# Patient Record
Sex: Male | Born: 1965 | Race: White | Hispanic: No | Marital: Single | Smoking: Current every day smoker
Health system: Southern US, Community
[De-identification: ages and names within clinical notes are randomized; demographics above are authoritative.]

## PROBLEM LIST (undated history)

## (undated) DIAGNOSIS — F909 Attention-deficit hyperactivity disorder, unspecified type: Secondary | ICD-10-CM

---

## 2004-10-05 ENCOUNTER — Emergency Department (HOSPITAL_COMMUNITY): Admission: EM | Admit: 2004-10-05 | Discharge: 2004-10-05 | Payer: Self-pay | Admitting: Emergency Medicine

## 2005-08-16 ENCOUNTER — Emergency Department (HOSPITAL_COMMUNITY): Admission: EM | Admit: 2005-08-16 | Discharge: 2005-08-16 | Payer: Self-pay | Admitting: Family Medicine

## 2005-08-24 ENCOUNTER — Ambulatory Visit (HOSPITAL_COMMUNITY): Admission: RE | Admit: 2005-08-24 | Discharge: 2005-08-24 | Payer: Self-pay | Admitting: Internal Medicine

## 2005-08-24 ENCOUNTER — Ambulatory Visit: Payer: Self-pay | Admitting: Internal Medicine

## 2005-09-19 ENCOUNTER — Emergency Department (HOSPITAL_COMMUNITY): Admission: EM | Admit: 2005-09-19 | Discharge: 2005-09-19 | Payer: Self-pay | Admitting: Family Medicine

## 2005-10-08 ENCOUNTER — Emergency Department (HOSPITAL_COMMUNITY): Admission: EM | Admit: 2005-10-08 | Discharge: 2005-10-08 | Payer: Self-pay | Admitting: Family Medicine

## 2009-07-21 ENCOUNTER — Emergency Department: Payer: Self-pay | Admitting: Emergency Medicine

## 2012-04-09 ENCOUNTER — Emergency Department: Payer: Self-pay | Admitting: *Deleted

## 2014-08-12 ENCOUNTER — Inpatient Hospital Stay: Payer: Self-pay | Admitting: Internal Medicine

## 2014-08-12 LAB — COMPREHENSIVE METABOLIC PANEL
ALK PHOS: 84 U/L
ANION GAP: 6 — AB (ref 7–16)
AST: 25 U/L (ref 15–37)
Albumin: 3.3 g/dL — ABNORMAL LOW (ref 3.4–5.0)
BILIRUBIN TOTAL: 0.2 mg/dL (ref 0.2–1.0)
BUN: 17 mg/dL (ref 7–18)
CO2: 26 mmol/L (ref 21–32)
Calcium, Total: 8.4 mg/dL — ABNORMAL LOW (ref 8.5–10.1)
Chloride: 109 mmol/L — ABNORMAL HIGH (ref 98–107)
Creatinine: 1.02 mg/dL (ref 0.60–1.30)
EGFR (African American): >60
GLUCOSE: 107 mg/dL — AB (ref 65–99)
OSMOLALITY: 283 (ref 275–301)
Potassium: 3.9 mmol/L (ref 3.5–5.1)
SGPT (ALT): 28 U/L
SODIUM: 141 mmol/L (ref 136–145)
Total Protein: 6.8 g/dL (ref 6.4–8.2)

## 2014-08-12 LAB — CBC
HCT: 43.6 % (ref 40.0–52.0)
HGB: 14.3 g/dL (ref 13.0–18.0)
MCH: 31.1 pg (ref 26.0–34.0)
MCHC: 32.9 g/dL (ref 32.0–36.0)
MCV: 95 fL (ref 80–100)
PLATELETS: 265 10*3/uL (ref 150–440)
RBC: 4.6 10*6/uL (ref 4.40–5.90)
RDW: 13.8 % (ref 11.5–14.5)
WBC: 8.3 10*3/uL (ref 3.8–10.6)

## 2014-08-13 LAB — HEMOGLOBIN A1C: Hemoglobin A1C: 5.7 % (ref 4.2–6.3)

## 2014-08-17 LAB — CULTURE, BLOOD (SINGLE)

## 2014-12-06 NOTE — H&P (Signed)
PATIENT NAME:  Brett MillardWHITE, Ercil W MR#:  [id number] DATE OF BIRTH:  Oct 28, 1965  DATE OF ADMISSION:  08/12/2014  REFERRING PHYSICIAN:  Kathreen DevoidKevin A. Paduchowski, MD  PRIMARY CARE PHYSICIAN:  None.   ADMITTING PHYSICIAN:  Crissie FiguresEdavally N. Antha Niday, MD   CHIEF COMPLAINT:  Pain, swelling, and discharging wound of right hand for the past 2 weeks.  HISTORY OF PRESENT ILLNESS:  This is a 49 year old Caucasian male with no significant past medical history, who presents with the complaints of pain and swelling of the right hand with discharging wound ongoing for the past 2 weeks. The patient states that he had a foreign body stuck about 2 months ago, following which he started having some pain and swelling of the right hand middle finger, which gradually worsened in the past 2 weeks, and he developed a small superficial discharging wound around 2 weeks ago. At that time, he tried to  take out the foreign body with a needle and was able to just take out some dark-colored material but continued to have increasing pain and swelling with a discharging wound and came to the Emergency Room for further evaluation. The patient did not consult any medical provider for this problem, which has been going on for the past few weeks. No history of any fever. No loss of sensation or power.   In the Emergency Room, the patient was evaluated by the ED physician and was found to have cellulitis of the right hand and x-ray of the right hand revealed soft tissue swelling significant for soft tissue infection and also 1 mm foreign body along the right middle finger. Hence, the hospitalist service was consulted for further evaluation and management.   ED physician also consulted orthopedic surgeon on-call, who recommended for admission of the patient to the medical floor and IV antibiotics and he will consult in the morning. The patient denies any other symptoms such as fever or chills, chest pain, shortness of breath, cough, nausea, vomiting,  diarrhea, abdominal pain, or urinary symptoms. Blood cultures were drawn, and the patient was started on IV antibiotics, namely vancomycin and Zosyn, and currently the patient is comfortably lying in the bed. The pain in the right hand is under control with pain medications.   PAST MEDICAL HISTORY:  No history of hypertension, diabetes mellitus, or COPD.   PAST SURGICAL HISTORY:  No history of any surgeries performed in the past.   HOME MEDICATIONS:  No home medications the patient is taking at this time.   ALLERGIES:  VICODIN, WHICH CAUSES STOMACH UPSET.   FAMILY HISTORY:  Significant for mother with diabetes mellitus and father with diabetes mellitus and bone cancer.   SOCIAL HISTORY:  He is single. History of smoking about 1-1/2 packs per day for the past many years. Denies any alcohol or substance abuse.   REVIEW OF SYSTEMS:   CONSTITUTIONAL:  Negative for fever or chills. No fatigue. No generalized weakness.  EYES:  Negative for blurred vision or double vision. No pain. No redness. No inflammation.  EARS, NOSE, AND THROAT:  Negative for tinnitus, ear pain, hearing loss, epistaxis, nasal discharge, or difficulty swallowing.  RESPIRATORY:  Negative for cough, wheezing, hemoptysis, dyspnea, or painful respirations.  CARDIOVASCULAR:  Negative for chest pain, orthopnea, pedal edema, dyspnea on exertion, palpitations, dizziness, or syncope.  GASTROINTESTINAL:  Negative for nausea, vomiting, diarrhea, abdominal pain, hematemesis, melena, or GERD symptoms.  GENITOURINARY:  Negative for dysuria, hematuria, frequency, or urgency.  ENDOCRINE:  Negative for polyuria or polydipsia. No nocturia.  No heat or cold intolerance.  HEMATOLOGIC:  Negative for anemia, easy bruising or bleeding, or swollen glands.  INTEGUMENTARY:  Discharging wound on the right hand at the base of the middle finger. MUSCULOSKELETAL:  There is pain and swelling of the right hand middle finger as mentioned in the history of  present illness ongoing for the past few weeks with a discharging wound at the base of the middle finger on the right hand.  NEUROLOGICAL:  Negative for focal weakness or numbness. No history of CVA, TIA, or seizure disorder.  PSYCHIATRIC:  No anxiety, insomnia, or depression.   PHYSICAL EXAMINATION:  VITAL SIGNS:  Temperature 98.7 degrees Fahrenheit, pulse rate 90 per minute, respirations 18 per minute, blood pressure 117/80, oxygen saturation 95% on room air.  GENERAL:  Well-developed, well-nourished young male, alert, awake, and oriented, pleasant and cooperative, comfortably lying in the bed, not in any acute distress.  HEAD:  Atraumatic, normocephalic.  EYES:  Pupils are equal and react to light and accommodation. No conjunctival pallor. No scleral icterus. Extraocular movements are intact.  NOSE:  No nasal lesions. No external lesions.  ORAL CAVITY:  No mucosal lesions. No exudates.  NECK:  Supple. No JVD. No thyromegaly. No carotid bruit. Range of motion of the neck is normal.  RESPIRATORY:  Good respiratory effort. Not using accessory muscles of respiration. Bilateral vesicular breath sounds are present. No rales or rhonchi.  CARDIOVASCULAR:  S1, S2 regular. No murmurs appreciated. Peripheral pulses are equal at carotid, femoral, and pedal pulses. No peripheral edema.  GASTROINTESTINAL:  Abdomen is soft and nontender. No hepatosplenomegaly. Bowel sounds present and equal in all 4 quadrants. No tenderness. No rigidity. No guarding.  GENITOURINARY:  Deferred.  MUSCULOSKELETAL:  Right hand middle finger swelling with redness extending up to the right palm anterior aspect present and 1 cm superficial discharging wound with local edema present on the base of the right middle finger and the right palm. Local tenderness present. Peripheral pulses are intact. Neurovascularly intact peripherally.  SKIN:  Inspection shows 1 cm wide discharging ulcer on the right palm at the base of the right middle  finger.  LYMPHATIC:  No cervical lymphadenopathy.  VASCULAR:  Good dorsalis pedis and posterior tibial pulses.  NEUROLOGICAL:  Alert, awake, and oriented x 3. Cranial nerves II through XII are grossly intact. Motor strength is 5/5 in both upper and lower extremities. DTRs are 2+ bilaterally and symmetrical.  PSYCHIATRIC:  Judgment and insight are adequate. Alert and oriented x 3. No memory or mood disturbances.   LABORATORY DATA:  Serum glucose is 107, BUN 17, creatinine 1.02, sodium 141, potassium 3.9, chloride 109, bicarbonate 26, total calcium 8.4, serum protein 6.8, albumin 3.3, total bilirubin 0.2, alkaline phosphatase 84, AST 25, and ALT 28. WBC is 8.3, hemoglobin 14.3, hematocrit 43.6, and platelet count 265,000.   IMAGING STUDIES:   X-ray of the right hand:   1.  Middle finger soft tissue swelling without evidence of osseous infection or subcutaneous gas.   2.  A 1 mm foreign body along the third middle pharynx of indeterminate significance given small size.    ASSESSMENT AND PLAN:  This is a 49 year old Caucasian male with no significant past medical history, who presents with the complaints of ongoing pain and swelling of the right hand for the past 2 weeks with a discharging superficial wound.    1.  Cellulitis of the right hand middle finger following a foreign body. The patient is afebrile. Hosein blood cell count  is normal. Plan:  Admit to med-surge floor, blood cultures, IV antibiotics of vancomycin and Zosyn, local wound care, and orthopedic consult.  2.  Foreign body in the right middle finger 1 mm in size. Plan:  Orthopedics on-call was consulted by ED physician and advised to consult in the morning. Orthopedic consult placed for further followup.  3.  Active smoker. Counseled and advised to quit smoking. Offered nicotine replacement treatment. The patient not decided at this time.  4.  Mildly elevated blood sugars. No history of diabetes mellitus, but positive family history of  diabetes mellitus. Rule out diabetes mellitus. Check hemoglobin A1c and follow up accordingly.  5.  Deep vein thrombosis prophylaxis with subcutaneous Lovenox.  6.  Gastrointestinal prophylaxis with Protonix.   CODE STATUS:  Full code.   TIME SPENT:  45 minutes.   ____________________________ Crissie Figures, MD enr:nb D: 08/12/2014 02:50:58 ET T: 08/12/2014 03:17:22 ET JOB#: [id number]  cc: Crissie Figures, MD, <Dictator> Crissie Figures MD ELECTRONICALLY SIGNED 08/12/2014 20:34

## 2014-12-06 NOTE — Consult Note (Signed)
Brief Consult Note: Diagnosis: cellulitis right hand.   Patient was seen by consultant.   Comments: no evidence of flexor sheath infection or need for surgical I+D at present, may need if abscess develops.  Electronic Signatures: Leitha SchullerMenz, Jasmarie Coppock J (MD)  (Signed 29-Dec-15 07:36)  Authored: Brief Consult Note   Last Updated: 29-Dec-15 07:36 by Leitha SchullerMenz, Camarion Weier J (MD)

## 2014-12-10 NOTE — Discharge Summary (Signed)
Dates of Admission and Diagnosis:  Date of Admission 12-Aug-2014   Date of Discharge 13-Aug-2014   Admitting Diagnosis Foreign body and cellulitis in finger.   Final Diagnosis Foreign body and cellulitis in finger. Smoking.    Chief Complaint/History of Present Illness a 49 year old Caucasian male with no significant past medical history, who presents with the complaints of pain and swelling of the right hand with discharging wound ongoing for the past 2 weeks. The patient states that he had a foreign body stuck about 2 months ago, following which he started having some pain and swelling of the right hand middle finger, which gradually worsened in the past 2 weeks, and he developed a small superficial discharging wound around 2 weeks ago. At that time, he tried to  take out the foreign body with a needle and was able to just take out some dark-colored material but continued to have increasing pain and swelling with a discharging wound and came to the Emergency Room for further evaluation. The patient did not consult any medical provider for this problem, which has been going on for the past few weeks. No history of any fever. No loss of sensation or power.   In the Emergency Room, the patient was evaluated by the ED physician and was found to have cellulitis of the right hand and x-ray of the right hand revealed soft tissue swelling significant for soft tissue infection and also 1 mm foreign body along the right middle finger. Hence, the hospitalist service was consulted for further evaluation and management.  ED physician also consulted orthopedic surgeon on-call, who recommended for admission of the patient to the medical floor and IV antibiotics and he will consult in the morning. The patient denies any other symptoms such as fever or chills, chest pain, shortness of breath, cough, nausea, vomiting, diarrhea, abdominal pain, or urinary symptoms. Blood cultures were drawn, and the patient was  started on IV antibiotics, namely vancomycin and Zosyn, and currently the patient is comfortably lying in the bed. The pain in the right hand is under control with pain medications.   Allergies:  Vicodin: N/V/Diarrhea  Pertinent Past History:  Pertinent Past History none.   Hospital Course:  Hospital Course a 49 year old Caucasian male with no significant past medical history, who presents with the complaints of ongoing pain and swelling of the right hand for the past 2 weeks with a discharging superficial wound.    1.  Cellulitis of the right hand middle finger following a foreign body.   The patient is afebrile. Stangelo blood cell count is normal.    blood cultures, IV antibiotics of vancomycin and Zosyn, now swiched to clindamycin as much improved.    As per oprtho- no indication for surgery.  2.  Foreign body in the right middle finger 1 mm in size.     no need for surgery per ortho. 3.  Active smoker. Counseled and advised to quit smoking for 4 min.   Offered nicotine replacement treatment. 4.  Mildly elevated blood sugars. No history of diabetes mellitus, but positive family history of diabetes mellitus.  normal hemoglobin A1c. 5.  Deep vein thrombosis prophylaxis with subcutaneous Lovenox.   Condition on Discharge Stable   Code Status:  Code Status Full Code   DISCHARGE INSTRUCTIONS HOME MEDS:  Medication Reconciliation: Patient's Home Medications at Discharge:     Medication Instructions  acetaminophen-oxycodone 325 mg-5 mg oral tablet  1 tab(s) orally 1 to 3 times a day, As Needed, severe pain (  7-10/10) , As needed, severe pain (7-10/10)   clindamycin 300 mg oral capsule  1 cap(s) orally every 8 hours x 8 days   nicotine 14 mg/24 hr transdermal film, extended release  1 patch transdermal once a day     Physician's Instructions:  Diet Regular   Activity Limitations As tolerated   Return to Work Not Applicable   Time frame for Follow Up Appointment 1-2 weeks   Orthopedic clinic   Other Comments If swelling worsens or pain increases, please see orthopedics clinic.   TIME SPENT:  Total Time: Greater than 30 minutes   Electronic Signatures: Altamese Dilling (MD)  (Signed 01-Jan-16 09:12)  Authored: ADMISSION DATE AND DIAGNOSIS, CHIEF COMPLAINT/HPI, Allergies, PERTINENT PAST HISTORY, HOSPITAL COURSE, DISCHARGE INSTRUCTIONS HOME MEDS, PATIENT INSTRUCTIONS, TIME SPENT   Last Updated: 01-Jan-16 09:12 by Altamese Dilling (MD)

## 2016-06-06 ENCOUNTER — Emergency Department
Admission: EM | Admit: 2016-06-06 | Discharge: 2016-06-06 | Disposition: A | Discharge status: Home / Self Care | Payer: Self-pay | Attending: Emergency Medicine | Admitting: Emergency Medicine

## 2016-06-06 ENCOUNTER — Emergency Department: Payer: Self-pay

## 2016-06-06 ENCOUNTER — Encounter: Payer: Self-pay | Admitting: Emergency Medicine

## 2016-06-06 DIAGNOSIS — F17210 Nicotine dependence, cigarettes, uncomplicated: Secondary | ICD-10-CM | POA: Insufficient documentation

## 2016-06-06 DIAGNOSIS — F909 Attention-deficit hyperactivity disorder, unspecified type: Secondary | ICD-10-CM | POA: Insufficient documentation

## 2016-06-06 DIAGNOSIS — J441 Chronic obstructive pulmonary disease with (acute) exacerbation: Secondary | ICD-10-CM | POA: Insufficient documentation

## 2016-06-06 DIAGNOSIS — M5432 Sciatica, left side: Secondary | ICD-10-CM | POA: Insufficient documentation

## 2016-06-06 HISTORY — DX: Attention-deficit hyperactivity disorder, unspecified type: F90.9

## 2016-06-06 LAB — CBC WITH DIFFERENTIAL/PLATELET
Basophils Absolute: 0.1 10*3/uL (ref 0–0.1)
Basophils Relative: 1 %
EOS ABS: 0.3 10*3/uL (ref 0–0.7)
Eosinophils Relative: 2 %
HEMATOCRIT: 44.8 % (ref 40.0–52.0)
HEMOGLOBIN: 15.2 g/dL (ref 13.0–18.0)
LYMPHS ABS: 2.9 10*3/uL (ref 1.0–3.6)
LYMPHS PCT: 22 %
MCH: 30.8 pg (ref 26.0–34.0)
MCHC: 34 g/dL (ref 32.0–36.0)
MCV: 90.6 fL (ref 80.0–100.0)
MONOS PCT: 6 %
Monocytes Absolute: 0.8 10*3/uL (ref 0.2–1.0)
NEUTROS PCT: 69 %
Neutro Abs: 9.3 10*3/uL — ABNORMAL HIGH (ref 1.4–6.5)
Platelets: 249 10*3/uL (ref 150–440)
RBC: 4.94 MIL/uL (ref 4.40–5.90)
RDW: 13.9 % (ref 11.5–14.5)
WBC: 13.4 10*3/uL — AB (ref 3.8–10.6)

## 2016-06-06 LAB — COMPREHENSIVE METABOLIC PANEL
ALK PHOS: 70 U/L (ref 38–126)
ALT: 20 U/L (ref 17–63)
ANION GAP: 7 (ref 5–15)
AST: 29 U/L (ref 15–41)
Albumin: 4 g/dL (ref 3.5–5.0)
BILIRUBIN TOTAL: 0.7 mg/dL (ref 0.3–1.2)
BUN: 9 mg/dL (ref 6–20)
CALCIUM: 8.9 mg/dL (ref 8.9–10.3)
CO2: 22 mmol/L (ref 22–32)
CREATININE: 0.91 mg/dL (ref 0.61–1.24)
Chloride: 108 mmol/L (ref 101–111)
Glucose, Bld: 114 mg/dL — ABNORMAL HIGH (ref 65–99)
Potassium: 3.8 mmol/L (ref 3.5–5.1)
SODIUM: 137 mmol/L (ref 135–145)
TOTAL PROTEIN: 7 g/dL (ref 6.5–8.1)

## 2016-06-06 LAB — TROPONIN I

## 2016-06-06 MED ORDER — ALBUTEROL SULFATE (2.5 MG/3ML) 0.083% IN NEBU
5.0000 mg | INHALATION_SOLUTION | Freq: Once | RESPIRATORY_TRACT | Status: AC
  Administered 2016-06-06: 5 mg via RESPIRATORY_TRACT
  Filled 2016-06-06: qty 6

## 2016-06-06 MED ORDER — CYCLOBENZAPRINE HCL 5 MG PO TABS: 5.0000 mg | ORAL_TABLET | Freq: Three times a day (TID) | ORAL | 0 refills | Status: DC | PRN

## 2016-06-06 MED ORDER — IPRATROPIUM-ALBUTEROL 0.5-2.5 (3) MG/3ML IN SOLN
3.0000 mL | Freq: Once | RESPIRATORY_TRACT | Status: AC
  Administered 2016-06-06: 3 mL via RESPIRATORY_TRACT
  Filled 2016-06-06: qty 3

## 2016-06-06 MED ORDER — CYCLOBENZAPRINE HCL 10 MG PO TABS
5.0000 mg | ORAL_TABLET | Freq: Once | ORAL | Status: DC
  Filled 2016-06-06: qty 1

## 2016-06-06 MED ORDER — ALBUTEROL SULFATE HFA 108 (90 BASE) MCG/ACT IN AERS: 2.0000 | INHALATION_SPRAY | RESPIRATORY_TRACT | 0 refills | Status: DC | PRN

## 2016-06-06 MED ORDER — PREDNISONE 50 MG PO TABS: 50.0000 mg | ORAL_TABLET | Freq: Every day | ORAL | 0 refills | Status: AC

## 2016-06-06 MED ORDER — METHYLPREDNISOLONE SODIUM SUCC 125 MG IJ SOLR
125.0000 mg | Freq: Once | INTRAMUSCULAR | Status: AC
  Administered 2016-06-06: 125 mg via INTRAVENOUS
  Filled 2016-06-06: qty 2

## 2016-06-06 MED ORDER — SODIUM CHLORIDE 0.9 % IV BOLUS (SEPSIS)
1000.0000 mL | Freq: Once | INTRAVENOUS | Status: AC
  Administered 2016-06-06: 1000 mL via INTRAVENOUS

## 2016-06-06 MED ORDER — AZITHROMYCIN 250 MG PO TABS: ORAL_TABLET | ORAL | 0 refills | Status: AC

## 2016-06-06 MED ORDER — IBUPROFEN 800 MG PO TABS: 800.0000 mg | ORAL_TABLET | Freq: Three times a day (TID) | ORAL | 0 refills | Status: DC | PRN

## 2016-06-06 MED ORDER — KETOROLAC TROMETHAMINE 30 MG/ML IJ SOLN
30.0000 mg | Freq: Once | INTRAMUSCULAR | Status: AC
  Administered 2016-06-06: 30 mg via INTRAVENOUS
  Filled 2016-06-06: qty 1

## 2016-06-06 NOTE — ED Triage Notes (Signed)
C/O SOB for months and c/o left lower back pain that radiates down left leg with cough.

## 2016-06-06 NOTE — ED Provider Notes (Signed)
[facility]-EMERGENCY DEPARTMENT Provider Note   CSN: [id number] Arrival date & time: 06/06/16  1127     History   Chief Complaint Chief Complaint  Patient presents with  . Shortness of Breath    HPI Brett Oneill is a 50 y.o. male hx of ADHD, smoker, COPD here with SOB. SOB for the last few days. Has nonproductive cough as well. Patient has some chills as well. Patient still smokes at least a pack a day. Patient also has a history of sciatica and states that sometimes when he coughs he has shooting pain down the left leg. Denies any weakness to the leg. Denies any trouble walking or trouble urinating.   The history is provided by the patient.    Past Medical History:  Diagnosis Date  . Attention deficit hyperactivity disorder     There are no active problems to display for this patient.   History reviewed. No pertinent surgical history.     Home Medications    Prior to Admission medications   Not on File    Family History No family history on file.  Social History Social History  Substance Use Topics  . Smoking status: Current Every Day Smoker    Packs/day: 2.00    Types: Cigarettes  . Smokeless tobacco: Never Used  . Alcohol use No     Allergies   Vicodin [hydrocodone-acetaminophen]   Review of Systems Review of Systems  Respiratory: Positive for shortness of breath.   Musculoskeletal: Positive for back pain.  All other systems reviewed and are negative.    Physical Exam Updated Vital Signs BP 116/77   Pulse 72   Temp 97.4 F (36.3 C) (Oral)   Resp 15   Ht 5\' 6"  (1.676 m)   Wt 180 lb (81.6 kg)   SpO2 100%   BMI 29.05 kg/m   Physical Exam  Constitutional: He is oriented to person, place, and time.  Slightly uncomfortable   HENT:  Head: Normocephalic.  Eyes: EOM are normal. Pupils are equal, round, and reactive to light.  Neck: Normal range of motion. Neck supple.  Cardiovascular: Normal rate, regular rhythm and normal heart sounds.     Pulmonary/Chest:  Slightly tachypneic, mild wheezing throughout   Abdominal: Soft. Bowel sounds are normal.  Musculoskeletal: Normal range of motion.  Neurological: He is alert and oriented to person, place, and time.  CN 2-12 intact. + straight leg raise on L side. No saddle anesthesia. Nl strength lower extremities, nl gait   Skin: Skin is warm.  Nursing note and vitals reviewed.    ED Treatments / Results  Labs (all labs ordered are listed, but only abnormal results are displayed) Labs Reviewed  CBC WITH DIFFERENTIAL/PLATELET - Abnormal; Notable for the following:       Result Value   WBC 13.4 (*)    Neutro Abs 9.3 (*)    All other components within normal limits  COMPREHENSIVE METABOLIC PANEL - Abnormal; Notable for the following:    Glucose, Bld 114 (*)    All other components within normal limits  TROPONIN I    EKG  EKG Interpretation None      ED ECG REPORT I, Richardean Canalavid H Gabriela Giannelli, the attending physician, personally viewed and interpreted this ECG.   Date: 06/06/2016  EKG Time: 11:43 pm  Rate: 89  Rhythm: normal EKG, normal sinus rhythm  Axis: normal  Intervals:none  ST&T Change: nonspecific changes    Radiology Dg Chest 2 View  Result Date: 06/06/2016 CLINICAL  DATA:  SOB x 1week, coughing up mucus, trimmers, smokes 1 1/2 ppd EXAM: CHEST  2 VIEW COMPARISON:  None. FINDINGS: Mild hyperinflation. Midline trachea. Normal heart size and mediastinal contours. No pleural effusion or pneumothorax. Clear lungs. Diffuse peribronchial thickening. IMPRESSION: 1.  No acute cardiopulmonary disease. 2. Mild peribronchial thickening which may relate to chronic bronchitis or smoking. Electronically Signed   By: Jeronimo Greaves M.D.   On: 06/06/2016 12:10    Procedures Procedures (including critical care time)  Medications Ordered in ED Medications  ipratropium-albuterol (DUONEB) 0.5-2.5 (3) MG/3ML nebulizer solution 3 mL (not administered)  cyclobenzaprine (FLEXERIL) tablet 5  mg (not administered)  albuterol (PROVENTIL) (2.5 MG/3ML) 0.083% nebulizer solution 5 mg (5 mg Nebulization Given 06/06/16 1425)  methylPREDNISolone sodium succinate (SOLU-MEDROL) 125 mg/2 mL injection 125 mg (125 mg Intravenous Given 06/06/16 1425)  sodium chloride 0.9 % bolus 1,000 mL (1,000 mLs Intravenous New Bag/Given 06/06/16 1437)  ketorolac (TORADOL) 30 MG/ML injection 30 mg (30 mg Intravenous Given 06/06/16 1426)     Initial Impression / Assessment and Plan / ED Course  I have reviewed the triage vital signs and the nursing notes.  Pertinent labs & imaging results that were available during my care of the patient were reviewed by me and considered in my medical decision making (see chart for details).  Clinical Course    Brett Oneill is a 50 y.o. male here with SOB, also sciatica symptoms L side. Neurovascular intact so will not need MRI. Will check labs, CXR. Will give steroids, albuterol and likely give zpack.   3:02 PM Labs showed WBC 13. Chemistry unremarkable. CXR showed bronchitis. Will dc home with prednisone, albuterol, zpack. Will give flexeril, motrin for L leg sciatica.   Final Clinical Impressions(s) / ED Diagnoses   Final diagnoses:  None    New Prescriptions New Prescriptions   No medications on file     Charlynne Pander, MD 06/06/16 1502

## 2016-06-06 NOTE — Discharge Instructions (Signed)
Take zpack as prescribed.   Stop smoking.  Take prednisone as prescribed for 5 days.   Use albuterol as needed for wheezing.   Take motrin for back pain.   Take flexeril for back spasms.   See your doctor.   Return to ER if you have worse shortness of breath, fever, chest pain, vomiting, weakness, trouble walking, numbness, trouble urinating.

## 2016-06-21 ENCOUNTER — Emergency Department
Admission: EM | Admit: 2016-06-21 | Discharge: 2016-06-21 | Disposition: A | Discharge status: Home / Self Care | Payer: Self-pay | Attending: Emergency Medicine | Admitting: Emergency Medicine

## 2016-06-21 ENCOUNTER — Encounter: Payer: Self-pay | Admitting: Emergency Medicine

## 2016-06-21 ENCOUNTER — Emergency Department: Payer: Self-pay

## 2016-06-21 DIAGNOSIS — Y929 Unspecified place or not applicable: Secondary | ICD-10-CM | POA: Insufficient documentation

## 2016-06-21 DIAGNOSIS — F909 Attention-deficit hyperactivity disorder, unspecified type: Secondary | ICD-10-CM | POA: Insufficient documentation

## 2016-06-21 DIAGNOSIS — Z79899 Other long term (current) drug therapy: Secondary | ICD-10-CM | POA: Insufficient documentation

## 2016-06-21 DIAGNOSIS — Y9372 Activity, wrestling: Secondary | ICD-10-CM | POA: Insufficient documentation

## 2016-06-21 DIAGNOSIS — Z791 Long term (current) use of non-steroidal anti-inflammatories (NSAID): Secondary | ICD-10-CM | POA: Insufficient documentation

## 2016-06-21 DIAGNOSIS — F17210 Nicotine dependence, cigarettes, uncomplicated: Secondary | ICD-10-CM | POA: Insufficient documentation

## 2016-06-21 DIAGNOSIS — Y998 Other external cause status: Secondary | ICD-10-CM | POA: Insufficient documentation

## 2016-06-21 DIAGNOSIS — W1839XA Other fall on same level, initial encounter: Secondary | ICD-10-CM | POA: Insufficient documentation

## 2016-06-21 DIAGNOSIS — R0789 Other chest pain: Secondary | ICD-10-CM | POA: Insufficient documentation

## 2016-06-21 MED ORDER — TRAMADOL HCL 50 MG PO TABS: 50.0000 mg | ORAL_TABLET | Freq: Four times a day (QID) | ORAL | 0 refills | Status: AC | PRN

## 2016-06-21 MED ORDER — ORPHENADRINE CITRATE 30 MG/ML IJ SOLN
60.0000 mg | Freq: Two times a day (BID) | INTRAMUSCULAR | Status: DC
  Administered 2016-06-21: 60 mg via INTRAMUSCULAR
  Filled 2016-06-21: qty 2

## 2016-06-21 MED ORDER — KETOROLAC TROMETHAMINE 60 MG/2ML IM SOLN
60.0000 mg | Freq: Once | INTRAMUSCULAR | Status: AC
  Administered 2016-06-21: 60 mg via INTRAMUSCULAR
  Filled 2016-06-21: qty 2

## 2016-06-21 MED ORDER — IBUPROFEN 600 MG PO TABS: 600.0000 mg | ORAL_TABLET | Freq: Three times a day (TID) | ORAL | 0 refills | Status: DC | PRN

## 2016-06-21 MED ORDER — CYCLOBENZAPRINE HCL 10 MG PO TABS: 10.0000 mg | ORAL_TABLET | Freq: Three times a day (TID) | ORAL | 0 refills | Status: DC | PRN

## 2016-06-21 NOTE — ED Provider Notes (Signed)
[facility] Emergency Department Provider Note   ____________________________________________   First MD Initiated Contact with Patient 06/21/16 0945     (approximate)  I have reviewed the triage vital signs and the nursing notes.   HISTORY  Chief Complaint rib pain    HPI Lona MillardGerald W Canaday is a 50 y.o. male patient complaining of left-sided side rib pain secondary to wrestling with his dog yesterday. Patient state he might have pulled a muscle. Patient stated pain increases with deep breathing. No palliative measures taken for this complaint.She rated his pain as a 10 over 10. Patient described the pain as "sharp".   Past Medical History:  Diagnosis Date  . Attention deficit hyperactivity disorder     There are no active problems to display for this patient.   History reviewed. No pertinent surgical history.  Prior to Admission medications   Medication Sig Start Date End Date Taking? Authorizing Provider  albuterol (PROVENTIL HFA;VENTOLIN HFA) 108 (90 Base) MCG/ACT inhaler Inhale 2 puffs into the lungs every 4 (four) hours as needed for wheezing or shortness of breath (cough). 06/06/16   Charlynne Panderavid Hsienta Yao, MD  azithromycin (ZITHROMAX Z-PAK) 250 MG tablet 2 po day one, then 1 daily x 4 days 06/06/16   Charlynne Panderavid Hsienta Yao, MD  cyclobenzaprine (FLEXERIL) 10 MG tablet Take 1 tablet (10 mg total) by mouth 3 (three) times daily as needed. 06/21/16   Joni Reiningonald K Ladavion Savitz, PA-C  cyclobenzaprine (FLEXERIL) 5 MG tablet Take 1 tablet (5 mg total) by mouth 3 (three) times daily as needed for muscle spasms. 06/06/16   Charlynne Panderavid Hsienta Yao, MD  ibuprofen (ADVIL,MOTRIN) 600 MG tablet Take 1 tablet (600 mg total) by mouth every 8 (eight) hours as needed. 06/21/16   Joni Reiningonald K Tremaine Earwood, PA-C  ibuprofen (ADVIL,MOTRIN) 800 MG tablet Take 1 tablet (800 mg total) by mouth every 8 (eight) hours as needed. 06/06/16   Charlynne Panderavid Hsienta Yao, MD  predniSONE (DELTASONE) 50 MG tablet Take 1 tablet (50  mg total) by mouth daily with breakfast. 06/06/16   Charlynne Panderavid Hsienta Yao, MD  traMADol (ULTRAM) 50 MG tablet Take 1 tablet (50 mg total) by mouth every 6 (six) hours as needed. 06/21/16 06/21/17  Joni Reiningonald K Abdon Petrosky, PA-C    Allergies Vicodin [hydrocodone-acetaminophen]  No family history on file.  Social History Social History  Substance Use Topics  . Smoking status: Current Every Day Smoker    Packs/day: 2.00    Types: Cigarettes  . Smokeless tobacco: Never Used  . Alcohol use No    Review of Systems Constitutional: No fever/chills Eyes: No visual changes. ENT: No sore throat. Cardiovascular: Denies chest pain. Respiratory: Denies shortness of breath. Gastrointestinal: No abdominal pain.  No nausea, no vomiting.  No diarrhea.  No constipation. Genitourinary: Negative for dysuria. Musculoskeletal:Lateral rib pain Skin: Negative for rash. Neurological: Negative for headaches, focal weakness or numbness.    ____________________________________________   PHYSICAL EXAM:  VITAL SIGNS: ED Triage Vitals  Enc Vitals Group     BP 06/21/16 0933 124/82     Pulse Rate 06/21/16 0933 85     Resp 06/21/16 0933 18     Temp 06/21/16 0933 98 F (36.7 C)     Temp Source 06/21/16 0933 Oral     SpO2 06/21/16 0933 97 %     Weight 06/21/16 0925 180 lb (81.6 kg)     Height 06/21/16 0925 5\' 6"  (1.676 m)     Head Circumference --  Peak Flow --      Pain Score 06/21/16 0925 10     Pain Loc --      Pain Edu? --      Excl. in GC? --     Constitutional: Alert and oriented. Moderate distress Eyes: Conjunctivae are normal. PERRL. EOMI. Head: Atraumatic. Nose: No congestion/rhinnorhea. Mouth/Throat: Mucous membranes are moist.  Oropharynx non-erythematous. Neck: No stridor.  No cervical spine tenderness to palpation. Hematological/Lymphatic/Immunilogical: No cervical lymphadenopathy. Cardiovascular: Normal rate, regular rhythm. Grossly normal heart sounds.  Good peripheral  circulation. Respiratory: Normal respiratory effort.  No retractions. Lungs CTAB. Gastrointestinal: Soft and nontender. No distention. No abdominal bruits. No CVA tenderness. Musculoskeletal: Obvious deformity of the chest wall. Patient has splinting inspirations.  Neurologic:  Normal speech and language. No gross focal neurologic deficits are appreciated. No gait instability. Skin:  Skin is warm, dry and intact. No rash noted. Psychiatric: Mood and affect are normal. Speech and behavior are normal.  ____________________________________________   LABS (all labs ordered are listed, but only abnormal results are displayed)  Labs Reviewed - No data to display ____________________________________________  EKG   ____________________________________________  RADIOLOGY  No acute findings x-ray of the left ribs. ____________________________________________   PROCEDURES  Procedure(s) performed: None  Procedures  Critical Care performed: No  ____________________________________________   INITIAL IMPRESSION / ASSESSMENT AND PLAN / ED COURSE  Pertinent labs & imaging results that were available during my care of the patient were reviewed by me and considered in my medical decision making (see chart for details).  Left chest wall pain. Discussed negative x-ray findings with patient. Patient given discharge care instructions. Patient given a prescription for tramadol, Flexeril, Lexapro. Patient advised follow "clinic condition persists.  Clinical Course      ____________________________________________   FINAL CLINICAL IMPRESSION(S) / ED DIAGNOSES  Final diagnoses:  Left-sided chest wall pain      NEW MEDICATIONS STARTED DURING THIS VISIT:  New Prescriptions   CYCLOBENZAPRINE (FLEXERIL) 10 MG TABLET    Take 1 tablet (10 mg total) by mouth 3 (three) times daily as needed.   IBUPROFEN (ADVIL,MOTRIN) 600 MG TABLET    Take 1 tablet (600 mg total) by mouth every 8 (eight)  hours as needed.   TRAMADOL (ULTRAM) 50 MG TABLET    Take 1 tablet (50 mg total) by mouth every 6 (six) hours as needed.     Note:  This document was prepared using Dragon voice recognition software and may include unintentional dictation errors.    Joni Reiningonald K Fay Swider, PA-C 06/21/16 1040    Jene Everyobert Kinner, MD 06/21/16 1415

## 2016-06-21 NOTE — ED Notes (Addendum)
See triage note  Presents with pain to left rib area  Denies any fall but was wrestling with dog yesterday states he threw a stick and then he ran and fell  Landed on left rib area Ambulates to room holding lateral /anterior rib area

## 2016-06-21 NOTE — ED Triage Notes (Signed)
Pt to ed with c/o left sided rib pain today.  Pt states he was wrestling with his dog yesterday and feels he may have pulled a muscle.  Pt reports pain in left rib area with deep breath and movement.

## 2017-03-16 ENCOUNTER — Emergency Department
Admission: EM | Admit: 2017-03-16 | Discharge: 2017-03-16 | Disposition: A | Discharge status: Home / Self Care | Payer: Self-pay | Attending: Emergency Medicine | Admitting: Emergency Medicine

## 2017-03-16 ENCOUNTER — Emergency Department: Payer: Self-pay

## 2017-03-16 ENCOUNTER — Encounter: Payer: Self-pay | Admitting: Emergency Medicine

## 2017-03-16 DIAGNOSIS — Y9389 Activity, other specified: Secondary | ICD-10-CM | POA: Insufficient documentation

## 2017-03-16 DIAGNOSIS — M7022 Olecranon bursitis, left elbow: Secondary | ICD-10-CM | POA: Insufficient documentation

## 2017-03-16 DIAGNOSIS — Z79899 Other long term (current) drug therapy: Secondary | ICD-10-CM | POA: Insufficient documentation

## 2017-03-16 DIAGNOSIS — F17210 Nicotine dependence, cigarettes, uncomplicated: Secondary | ICD-10-CM | POA: Insufficient documentation

## 2017-03-16 MED ORDER — TRAMADOL HCL 50 MG PO TABS: 50.0000 mg | ORAL_TABLET | Freq: Four times a day (QID) | ORAL | 0 refills | Status: DC | PRN

## 2017-03-16 MED ORDER — NAPROXEN 500 MG PO TABS: 500.0000 mg | ORAL_TABLET | Freq: Two times a day (BID) | ORAL | Status: AC

## 2017-03-16 NOTE — ED Provider Notes (Signed)
[facility] Emergency Department Provider Note   ____________________________________________   First MD Initiated Contact with Patient 03/16/17 1352     (approximate)  I have reviewed the triage vital signs and the nursing notes.   HISTORY  Chief Complaint Elbow Pain    HPI Brett Oneill is a 51 y.o. male patient complaining of left elbow pain and swelling for 2 days. States increasing pain with extension of the forearm. Patient states swelling has increased overnight.Patient works in [employer] and performs repetitive motion. Patient rates pain as 8/10 describe the pain as "achy". No palliative measures for complaint.   Past Medical History:  Diagnosis Date  . Attention deficit hyperactivity disorder     There are no active problems to display for this patient.   History reviewed. No pertinent surgical history.  Prior to Admission medications   Medication Sig Start Date End Date Taking? Authorizing Provider  albuterol (PROVENTIL HFA;VENTOLIN HFA) 108 (90 Base) MCG/ACT inhaler Inhale 2 puffs into the lungs every 4 (four) hours as needed for wheezing or shortness of breath (cough). 06/06/16   Charlynne PanderYao, David Hsienta, MD  azithromycin (ZITHROMAX Z-PAK) 250 MG tablet 2 po day one, then 1 daily x 4 days 06/06/16   Charlynne PanderYao, David Hsienta, MD  cyclobenzaprine (FLEXERIL) 10 MG tablet Take 1 tablet (10 mg total) by mouth 3 (three) times daily as needed. 06/21/16   Joni ReiningSmith, Charistopher Rumble K, PA-C  cyclobenzaprine (FLEXERIL) 5 MG tablet Take 1 tablet (5 mg total) by mouth 3 (three) times daily as needed for muscle spasms. 06/06/16   Charlynne PanderYao, David Hsienta, MD  ibuprofen (ADVIL,MOTRIN) 600 MG tablet Take 1 tablet (600 mg total) by mouth every 8 (eight) hours as needed. 06/21/16   Joni ReiningSmith, Norina Cowper K, PA-C  ibuprofen (ADVIL,MOTRIN) 800 MG tablet Take 1 tablet (800 mg total) by mouth every 8 (eight) hours as needed. 06/06/16   Charlynne PanderYao, David Hsienta, MD  naproxen (NAPROSYN) 500 MG tablet  Take 1 tablet (500 mg total) by mouth 2 (two) times daily with a meal. 03/16/17   Joni ReiningSmith, Ameisha Mcclellan K, PA-C  predniSONE (DELTASONE) 50 MG tablet Take 1 tablet (50 mg total) by mouth daily with breakfast. 06/06/16   Charlynne PanderYao, David Hsienta, MD  traMADol (ULTRAM) 50 MG tablet Take 1 tablet (50 mg total) by mouth every 6 (six) hours as needed. 06/21/16 06/21/17  Joni ReiningSmith, Finnick Orosz K, PA-C  traMADol (ULTRAM) 50 MG tablet Take 1 tablet (50 mg total) by mouth every 6 (six) hours as needed for moderate pain. 03/16/17   Joni ReiningSmith, Ronell Boldin K, PA-C    Allergies Vicodin [hydrocodone-acetaminophen]  No family history on file.  Social History Social History  Substance Use Topics  . Smoking status: Current Every Day Smoker    Packs/day: 2.00    Types: Cigarettes  . Smokeless tobacco: Never Used  . Alcohol use No    Review of Systems  Constitutional: No fever/chills Eyes: No visual changes. ENT: No sore throat. Cardiovascular: Denies chest pain. Respiratory: Denies shortness of breath. Gastrointestinal: No abdominal pain.  No nausea, no vomiting.  No diarrhea.  No constipation. Genitourinary: Negative for dysuria. Musculoskeletal: Negative for back pain. Skin: Negative for rash. Neurological: Negative for headaches, focal weakness or numbness. Psychiatric:ADHD. Allergic/Immunilogical: Vicodin ____________________________________________   PHYSICAL EXAM:  VITAL SIGNS: ED Triage Vitals  Enc Vitals Group     BP 03/16/17 1334 127/87     Pulse Rate 03/16/17 1334 93     Resp 03/16/17 1334 16     Temp  03/16/17 1334 98.5 F (36.9 C)     Temp Source 03/16/17 1334 Oral     SpO2 03/16/17 1334 97 %     Weight 03/16/17 1335 180 lb (81.6 kg)     Height 03/16/17 1335 5\' 6"  (1.676 m)     Head Circumference --      Peak Flow --      Pain Score 03/16/17 1315 8     Pain Loc --      Pain Edu? --      Excl. in GC? --     Constitutional: Alert and oriented. Well appearing and in no acute distress. Cardiovascular:  Normal rate, regular rhythm. Grossly normal heart sounds.  Good peripheral circulation. Respiratory: Normal respiratory effort.  No retractions. Lungs CTAB. Musculoskeletal: No obvious deformity to the left elbow. Decreased range of motion with extension. Patient has moderate edema to the posterior elbow. No erythema.  Neurologic:  Normal speech and language. No gross focal neurologic deficits are appreciated. No gait instability. Skin:  Skin is warm, dry and intact. No rash noted. Psychiatric: Mood and affect are normal. Speech and behavior are normal.  ____________________________________________   LABS (all labs ordered are listed, but only abnormal results are displayed)  Labs Reviewed - No data to display ____________________________________________  EKG   ____________________________________________  RADIOLOGY  Dg Elbow Complete Left  Result Date: 03/16/2017 CLINICAL DATA:  Left elbow pain and swelling for 2 days without known injury. EXAM: LEFT ELBOW - COMPLETE 3+ VIEW COMPARISON:  None. FINDINGS: There is no evidence of fracture, dislocation, or joint effusion. There is no evidence of arthropathy or other focal bone abnormality. Soft tissues are unremarkable. IMPRESSION: Normal left elbow. Electronically Signed   By: Lupita RaiderJames  Green Jr, M.D.   On: 03/16/2017 14:26    __No acute findings x-ray of the elbow. __________________________________________   PROCEDURES  Procedure(s) performed: None  Procedures  Critical Care performed: No  ____________________________________________   INITIAL IMPRESSION / ASSESSMENT AND PLAN / ED COURSE  Pertinent labs & imaging results that were available during my care of the patient were reviewed by me and considered in my medical decision making (see chart for details). Patient presented with pain, swelling and decreased range of motion with extension of the left elbow for 2 days.  Olecranon bursitis of the left elbow. Discussed  negative x-ray finding with patient. Patient given discharge care instructions. Patient given a sling to wear for 3-5 days as needed. Patient advised to follow orthopedics clinic if no improvement in 5 days.    ____________________________________________   FINAL CLINICAL IMPRESSION(S) / ED DIAGNOSES  Final diagnoses:  Olecranon bursitis of left elbow      NEW MEDICATIONS STARTED DURING THIS VISIT:  New Prescriptions   NAPROXEN (NAPROSYN) 500 MG TABLET    Take 1 tablet (500 mg total) by mouth 2 (two) times daily with a meal.   TRAMADOL (ULTRAM) 50 MG TABLET    Take 1 tablet (50 mg total) by mouth every 6 (six) hours as needed for moderate pain.     Note:  This document was prepared using Dragon voice recognition software and may include unintentional dictation errors.    Joni ReiningSmith, Sam Wunschel K, PA-C 03/16/17 1439    Don PerkingVeronese, [place], MD 03/17/17 1215

## 2017-03-16 NOTE — ED Notes (Signed)

## 2017-03-16 NOTE — Discharge Instructions (Signed)
Arm sling for 3-5 days as needed. °

## 2017-03-16 NOTE — ED Triage Notes (Signed)
Patient presents to the ED with left elbow pain and swelling x 2 days.  Patient states, "I thought it was probably tennis elbow or something, but then my arm started getting tingly today and it got me worried."  Patient is in no obvious distress at this time.

## 2017-07-03 ENCOUNTER — Emergency Department
Admission: EM | Admit: 2017-07-03 | Discharge: 2017-07-03 | Disposition: A | Discharge status: Home / Self Care | Payer: Self-pay | Attending: Emergency Medicine | Admitting: Emergency Medicine

## 2017-07-03 ENCOUNTER — Encounter: Payer: Self-pay | Admitting: *Deleted

## 2017-07-03 ENCOUNTER — Emergency Department: Payer: Self-pay

## 2017-07-03 ENCOUNTER — Other Ambulatory Visit: Payer: Self-pay

## 2017-07-03 DIAGNOSIS — S39012A Strain of muscle, fascia and tendon of lower back, initial encounter: Secondary | ICD-10-CM | POA: Insufficient documentation

## 2017-07-03 DIAGNOSIS — Y999 Unspecified external cause status: Secondary | ICD-10-CM | POA: Insufficient documentation

## 2017-07-03 DIAGNOSIS — F17210 Nicotine dependence, cigarettes, uncomplicated: Secondary | ICD-10-CM | POA: Insufficient documentation

## 2017-07-03 DIAGNOSIS — Y939 Activity, unspecified: Secondary | ICD-10-CM | POA: Insufficient documentation

## 2017-07-03 DIAGNOSIS — Z791 Long term (current) use of non-steroidal anti-inflammatories (NSAID): Secondary | ICD-10-CM | POA: Insufficient documentation

## 2017-07-03 DIAGNOSIS — X509XXA Other and unspecified overexertion or strenuous movements or postures, initial encounter: Secondary | ICD-10-CM | POA: Insufficient documentation

## 2017-07-03 DIAGNOSIS — Z79899 Other long term (current) drug therapy: Secondary | ICD-10-CM | POA: Insufficient documentation

## 2017-07-03 DIAGNOSIS — Y929 Unspecified place or not applicable: Secondary | ICD-10-CM | POA: Insufficient documentation

## 2017-07-03 MED ORDER — TRAMADOL HCL 50 MG PO TABS: 50.0000 mg | ORAL_TABLET | Freq: Four times a day (QID) | ORAL | 0 refills | Status: DC | PRN

## 2017-07-03 MED ORDER — CYCLOBENZAPRINE HCL 10 MG PO TABS: 10.0000 mg | ORAL_TABLET | Freq: Three times a day (TID) | ORAL | 0 refills | Status: DC | PRN

## 2017-07-03 MED ORDER — IBUPROFEN 600 MG PO TABS: 600.0000 mg | ORAL_TABLET | Freq: Three times a day (TID) | ORAL | 0 refills | Status: DC | PRN
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
## 2017-07-03 NOTE — ED Notes (Signed)
Pt ambulatory upon discharge. Verbalized understanding of discharge instructions, prescriptions, follow-up care and pain management. VSS. A&O x4. Skin warm and dry.

## 2017-07-03 NOTE — ED Triage Notes (Signed)
Pt has chronic back pain, pt stepped off ladder today , complains of low back pain and left leg pain, pt denies any other symptoms

## 2019-07-30 ENCOUNTER — Emergency Department
Admission: EM | Admit: 2019-07-30 | Discharge: 2019-07-30 | Disposition: A | Discharge status: Home / Self Care | Payer: Self-pay | Attending: Emergency Medicine | Admitting: Emergency Medicine

## 2019-07-30 ENCOUNTER — Encounter: Payer: Self-pay | Admitting: Emergency Medicine

## 2019-07-30 ENCOUNTER — Other Ambulatory Visit: Payer: Self-pay

## 2019-07-30 ENCOUNTER — Emergency Department: Payer: Self-pay

## 2019-07-30 DIAGNOSIS — F17210 Nicotine dependence, cigarettes, uncomplicated: Secondary | ICD-10-CM | POA: Insufficient documentation

## 2019-07-30 DIAGNOSIS — R0781 Pleurodynia: Secondary | ICD-10-CM | POA: Insufficient documentation

## 2019-07-30 DIAGNOSIS — M546 Pain in thoracic spine: Secondary | ICD-10-CM | POA: Insufficient documentation

## 2019-07-30 LAB — CBC
HCT: 43.8 % (ref 39.0–52.0)
Hemoglobin: 14.6 g/dL (ref 13.0–17.0)
MCH: 29.8 pg (ref 26.0–34.0)
MCHC: 33.3 g/dL (ref 30.0–36.0)
MCV: 89.4 fL (ref 80.0–100.0)
Platelets: 284 10*3/uL (ref 150–400)
RBC: 4.9 MIL/uL (ref 4.22–5.81)
RDW: 13.9 % (ref 11.5–15.5)
WBC: 11.6 10*3/uL — ABNORMAL HIGH (ref 4.0–10.5)
nRBC: 0 % (ref 0.0–0.2)

## 2019-07-30 LAB — BASIC METABOLIC PANEL
Anion gap: 9 (ref 5–15)
BUN: 11 mg/dL (ref 6–20)
CO2: 21 mmol/L — ABNORMAL LOW (ref 22–32)
Calcium: 8.8 mg/dL — ABNORMAL LOW (ref 8.9–10.3)
Chloride: 107 mmol/L (ref 98–111)
Creatinine, Ser: 0.95 mg/dL (ref 0.61–1.24)
GFR calc Af Amer: >60 mL/min (ref >60)
GFR calc non Af Amer: >60 mL/min (ref >60)
Glucose, Bld: 143 mg/dL — ABNORMAL HIGH (ref 70–99)
Potassium: 3.8 mmol/L (ref 3.5–5.1)
Sodium: 137 mmol/L (ref 135–145)

## 2019-07-30 LAB — FIBRIN DERIVATIVES D-DIMER (ARMC ONLY): Fibrin derivatives D-dimer (ARMC): 393.28 ng/mL (FEU) (ref 0.00–499.00)

## 2019-07-30 LAB — TROPONIN I (HIGH SENSITIVITY)
Troponin I (High Sensitivity): 4 ng/L (ref <18)
Troponin I (High Sensitivity): 4 ng/L (ref <18)

## 2019-07-30 MED ORDER — IBUPROFEN 600 MG PO TABS: 600.0000 mg | ORAL_TABLET | Freq: Four times a day (QID) | ORAL | 0 refills | Status: AC | PRN

## 2019-07-30 MED ORDER — ALBUTEROL SULFATE (2.5 MG/3ML) 0.083% IN NEBU
2.5000 mg | INHALATION_SOLUTION | Freq: Once | RESPIRATORY_TRACT | Status: AC
  Administered 2019-07-30: 10:00:00 2.5 mg via RESPIRATORY_TRACT
  Filled 2019-07-30: qty 3

## 2019-07-30 MED ORDER — ALBUTEROL SULFATE HFA 108 (90 BASE) MCG/ACT IN AERS: 2.0000 | INHALATION_SPRAY | Freq: Four times a day (QID) | RESPIRATORY_TRACT | 1 refills | Status: AC | PRN

## 2019-07-30 MED ORDER — CYCLOBENZAPRINE HCL 10 MG PO TABS: 10.0000 mg | ORAL_TABLET | Freq: Three times a day (TID) | ORAL | 0 refills | Status: AC | PRN

## 2019-07-30 MED ORDER — KETOROLAC TROMETHAMINE 30 MG/ML IJ SOLN
30.0000 mg | Freq: Once | INTRAMUSCULAR | Status: AC
  Administered 2019-07-30: 30 mg via INTRAVENOUS
  Filled 2019-07-30: qty 1

## 2019-07-30 NOTE — Discharge Instructions (Signed)
Take the ibuprofen every 6 hours over the next several days, and the Flexeril up to every 3 times per day as needed for the back pain.  You should use the albuterol inhaler up to every 6 hours as needed for chest tightness or shortness of breath.  You can follow-up at the [facility] to establish a primary care doctor.  Return to the ER for new, worsening, or persistent severe back or chest pain, difficulty breathing, weakness or lightheadedness, fever, or any other new or worsening symptoms that concern you.

## 2019-07-30 NOTE — ED Notes (Signed)
Sent green,purple, and green on ICE to lab.

## 2019-07-30 NOTE — ED Provider Notes (Signed)
[facility] Emergency Department Provider Note ____________________________________________   First MD Initiated Contact with Patient 07/30/19 0900     (approximate)  I have reviewed the triage vital signs and the nursing notes.   HISTORY  Chief Complaint Shortness of Breath    HPI Brett Oneill is a 53 y.o. male with PMH as noted below who presents with right-sided back pain, gradual onset over the last 4 days, worsening over the last day, radiate around to the mid back and somewhat around to the right side of the chest.  It is worse with changes in position and with movements of the right arm.  He states that he does not feel short of breath per se, but feels like he cannot get a full breath in.  He has had intermittent nonproductive cough.  He denies any fever or chills.  He has had no vomiting or diarrhea.  He has no known exposure to COVID-19.  He works in [employer] and reports that the pain developed after strenuous use of the right arm in the last week.  Patient denies any central chest pain, palpitations, or leg swelling.  Past Medical History:  Diagnosis Date  . Attention deficit hyperactivity disorder     There are no problems to display for this patient.   History reviewed. No pertinent surgical history.  Prior to Admission medications   Medication Sig Start Date End Date Taking? Authorizing Provider  albuterol (VENTOLIN HFA) 108 (90 Base) MCG/ACT inhaler Inhale 2 puffs into the lungs every 6 (six) hours as needed for wheezing or shortness of breath. 07/30/19   Dionne Bucy, MD  azithromycin (ZITHROMAX Z-PAK) 250 MG tablet 2 po day one, then 1 daily x 4 days Patient not taking: Reported on 07/30/2019 06/06/16   Charlynne Pander, MD  cyclobenzaprine (FLEXERIL) 10 MG tablet Take 1 tablet (10 mg total) by mouth 3 (three) times daily as needed for up to 5 days for muscle spasms. 07/30/19 08/04/19  Dionne Bucy, MD  ibuprofen  (ADVIL) 600 MG tablet Take 1 tablet (600 mg total) by mouth every 6 (six) hours as needed. 07/30/19   Dionne Bucy, MD  naproxen (NAPROSYN) 500 MG tablet Take 1 tablet (500 mg total) by mouth 2 (two) times daily with a meal. Patient not taking: Reported on 07/30/2019 03/16/17   Joni Reining, PA-C  predniSONE (DELTASONE) 50 MG tablet Take 1 tablet (50 mg total) by mouth daily with breakfast. Patient not taking: Reported on 07/30/2019 06/06/16   Charlynne Pander, MD  traMADol (ULTRAM) 50 MG tablet Take 1 tablet (50 mg total) by mouth every 6 (six) hours as needed for moderate pain. Patient not taking: Reported on 07/30/2019 03/16/17   Joni Reining, PA-C  traMADol (ULTRAM) 50 MG tablet Take 1 tablet (50 mg total) every 6 (six) hours as needed by mouth for moderate pain. Patient not taking: Reported on 07/30/2019 07/03/17   Joni Reining, PA-C    Allergies Vicodin [hydrocodone-acetaminophen]  No family history on file.  Social History Social History   Tobacco Use  . Smoking status: Current Every Day Smoker    Packs/day: 2.00    Types: Cigarettes  . Smokeless tobacco: Never Used  Substance Use Topics  . Alcohol use: No  . Drug use: Yes    Types: Cocaine    Review of Systems  Constitutional: No fever/chills. Eyes: No redness. ENT: No sore throat. Cardiovascular: Denies actually central chest pain. Respiratory: Denies shortness of breath.  Gastrointestinal: No vomiting or diarrhea.  Genitourinary: Negative for flank pain.  Musculoskeletal: Positive for back pain. Skin: Negative for rash. Neurological: Negative for focal weakness or numbness.   ____________________________________________   PHYSICAL EXAM:  VITAL SIGNS: ED Triage Vitals  Enc Vitals Group     BP 07/30/19 0840 136/85     Pulse Rate 07/30/19 0840 85     Resp 07/30/19 0840 19     Temp 07/30/19 0840 97.9 F (36.6 C)     Temp Source 07/30/19 0840 Oral     SpO2 07/30/19 0840 97 %     Weight  07/30/19 0840 205 lb (93 kg)     Height 07/30/19 0840 5\' 6"  (1.676 m)     Head Circumference --      Peak Flow --      Pain Score 07/30/19 0847 10     Pain Loc --      Pain Edu? --      Excl. in [place]? --     Constitutional: Alert and oriented. Well appearing and in no acute distress. Eyes: Conjunctivae are normal.  Head: Atraumatic. Nose: No congestion/rhinnorhea. Mouth/Throat: Mucous membranes are moist.   Neck: Normal range of motion.  Cardiovascular: Normal rate, regular rhythm. Grossly normal heart sounds.  Good peripheral circulation. Respiratory: Normal respiratory effort.  No retractions.  Scattered faint wheezes bilaterally.. Gastrointestinal: Soft and nontender. No distention.  Genitourinary: No flank tenderness. Musculoskeletal: No lower extremity edema.  No calf or popliteal swelling or tenderness.  Extremities warm and well perfused.  Right lower thoracic paraspinal and lateral muscle tenderness with no step-off or crepitus. Neurologic:  Normal speech and language. No gross focal neurologic deficits are appreciated.  Skin:  Skin is warm and dry. No rash noted. Psychiatric: Mood and affect are normal. Speech and behavior are normal.  ____________________________________________   LABS (all labs ordered are listed, but only abnormal results are displayed)  Labs Reviewed  BASIC METABOLIC PANEL - Abnormal; Notable for the following components:      Result Value   CO2 21 (*)    Glucose, Bld 143 (*)    Calcium 8.8 (*)    All other components within normal limits  CBC - Abnormal; Notable for the following components:   WBC 11.6 (*)    All other components within normal limits  FIBRIN DERIVATIVES D-DIMER ([facility] ONLY)  TROPONIN I (HIGH SENSITIVITY)  TROPONIN I (HIGH SENSITIVITY)   ____________________________________________  EKG  ED ECG REPORT I, Arta Silence, the attending physician, personally viewed and interpreted this ECG.  Date: 07/30/2019 EKG Time:  0853 Rate: 78 Rhythm: normal sinus rhythm QRS Axis: normal Intervals: normal ST/T Wave abnormalities: normal Narrative Interpretation: no evidence of acute ischemia; no significant change when compared to EKG of 06/06/2016  ____________________________________________  RADIOLOGY  CXR: No acute abnormality XR R rib: No acute abnormality  ____________________________________________   PROCEDURES  Procedure(s) performed: No  Procedures  Critical Care performed: No ____________________________________________   INITIAL IMPRESSION / ASSESSMENT AND PLAN / ED COURSE  Pertinent labs & imaging results that were available during my care of the patient were reviewed by me and considered in my medical decision making (see chart for details).  53 year old male with PMH as noted above and no significant known prior cardiac or pulmonary history who presents with right-sided back pain radiating to the right chest and worse with movement over the right arm as well as certain positions.  He also has a nonproductive intermittent cough.  On exam,  the patient is very well-appearing.  His vital signs are normal.  He has reproducible tenderness to the right upper back that is worse with palpation and movement of the right arm.  He does have some faint wheezing as well and has a history of using inhalers although has not been formally diagnosed with COPD.  Overall presentation is consistent with musculoskeletal\chest wall pain.  Given the somewhat pleuritic nature of the pain I will add on a D-dimer as well as basic labs and troponins x2.  Chest x-ray is negative, but I have added on a rib series to further evaluate.  We will give Toradol, albuterol, and reassess.  Anticipate discharge home if the work-up is negative.  ----------------------------------------- 2:29 PM on 07/30/2019 -----------------------------------------  Rib x-ray, D-dimer, and the initial and repeat troponin were all within  normal limits.  The patient reported improvement of his pain after Toradol.  He was stable for discharge.  I prescribed him ibuprofen and Flexeril as well as an albuterol inhaler.  I discussed the results of the work-up with him, gave him thorough return precautions, and he expressed understanding.  ____________________________________________   FINAL CLINICAL IMPRESSION(S) / ED DIAGNOSES  Final diagnoses:  Rib pain on right side  Acute right-sided thoracic back pain      NEW MEDICATIONS STARTED DURING THIS VISIT:  Discharge Medication List as of 07/30/2019 12:57 PM       Note:  This document was prepared using Dragon voice recognition software and may include unintentional dictation errors.    Dionne BucySiadecki, Dilyn Osoria, MD 07/30/19 1430

## 2019-07-30 NOTE — ED Notes (Signed)
Pt taken to xray at this time. Will give meds and draw blood when pt returns.

## 2019-07-30 NOTE — ED Triage Notes (Signed)
Pt here with c/o worsening shob over the past few days, denies covid or copd, denies fever, has been coughing, c/o pain in right shoulder blade and right mid back. NAD.

## 2019-12-05 ENCOUNTER — Emergency Department
Admission: EM | Admit: 2019-12-05 | Discharge: 2019-12-05 | Disposition: A | Discharge status: Home / Self Care | Payer: Self-pay | Attending: Student in an Organized Health Care Education/Training Program | Admitting: Student in an Organized Health Care Education/Training Program

## 2019-12-05 ENCOUNTER — Emergency Department: Payer: Self-pay

## 2019-12-05 ENCOUNTER — Encounter: Payer: Self-pay | Admitting: Emergency Medicine

## 2019-12-05 ENCOUNTER — Other Ambulatory Visit: Payer: Self-pay

## 2019-12-05 DIAGNOSIS — F17210 Nicotine dependence, cigarettes, uncomplicated: Secondary | ICD-10-CM | POA: Insufficient documentation

## 2019-12-05 DIAGNOSIS — M25512 Pain in left shoulder: Secondary | ICD-10-CM | POA: Insufficient documentation

## 2019-12-05 DIAGNOSIS — F1410 Cocaine abuse, uncomplicated: Secondary | ICD-10-CM | POA: Insufficient documentation

## 2019-12-05 MED ORDER — KETOROLAC TROMETHAMINE 10 MG PO TABS: 10.0000 mg | ORAL_TABLET | Freq: Four times a day (QID) | ORAL | 0 refills | Status: AC | PRN

## 2019-12-05 MED ORDER — TRAMADOL HCL 50 MG PO TABS: 50.0000 mg | ORAL_TABLET | Freq: Four times a day (QID) | ORAL | 0 refills | Status: AC | PRN

## 2019-12-05 MED ORDER — KETOROLAC TROMETHAMINE 30 MG/ML IJ SOLN
30.0000 mg | Freq: Once | INTRAMUSCULAR | Status: AC
  Administered 2019-12-05: 20:00:00 30 mg via INTRAMUSCULAR
  Filled 2019-12-05: qty 1

## 2019-12-05 NOTE — ED Notes (Addendum)
Pt states he had had left shoulder problems for 3 months but today the pain got worse after he picked up a load of shingles. Pt cannot raise left arm past mid chest and cannot extend left arm fully. Ice pack applied. Jewelry removed. Pt states has 10/10 pain.

## 2019-12-05 NOTE — ED Provider Notes (Signed)
Emergency Department Provider Note  ____________________________________________  Time seen: Approximately 7:19 PM  I have reviewed the triage vital signs and the nursing notes.   HISTORY  Chief Complaint Shoulder Pain   Historian Patient     HPI Brett Oneill is a 54 y.o. male with a history of ADHD, presents to the emergency department with acute left shoulder pain for the past 3 months.  Patient states that he has a baseline level of pain that he was picking up shingles today and felt a popping sensation.  He states that pain occurs from the posterior aspect of his left shoulder down his left elbow.  No numbness or tingling.  No falls or mechanisms of trauma.  He has difficulty with abduction of the left shoulder.   Past Medical History:  Diagnosis Date  . Attention deficit hyperactivity disorder      Immunizations up to date:  Yes.     Past Medical History:  Diagnosis Date  . Attention deficit hyperactivity disorder     There are no problems to display for this patient.   History reviewed. No pertinent surgical history.  Prior to Admission medications   Medication Sig Start Date End Date Taking? Authorizing Provider  albuterol (VENTOLIN HFA) 108 (90 Base) MCG/ACT inhaler Inhale 2 puffs into the lungs every 6 (six) hours as needed for wheezing or shortness of breath. 07/30/19   Dionne Bucy, MD  azithromycin (ZITHROMAX Z-PAK) 250 MG tablet 2 po day one, then 1 daily x 4 days Patient not taking: Reported on 07/30/2019 06/06/16   Charlynne Pander, MD  ibuprofen (ADVIL) 600 MG tablet Take 1 tablet (600 mg total) by mouth every 6 (six) hours as needed. 07/30/19   Dionne Bucy, MD  ketorolac (TORADOL) 10 MG tablet Take 1 tablet (10 mg total) by mouth every 6 (six) hours as needed for up to 5 days. 12/05/19 12/10/19  Orvil Feil, PA-C  naproxen (NAPROSYN) 500 MG tablet Take 1 tablet (500 mg total) by mouth 2 (two) times daily with a meal. Patient  not taking: Reported on 07/30/2019 03/16/17   Joni Reining, PA-C  predniSONE (DELTASONE) 50 MG tablet Take 1 tablet (50 mg total) by mouth daily with breakfast. Patient not taking: Reported on 07/30/2019 06/06/16   Charlynne Pander, MD  traMADol (ULTRAM) 50 MG tablet Take 1 tablet (50 mg total) by mouth every 6 (six) hours as needed for moderate pain. Patient not taking: Reported on 07/30/2019 03/16/17   Joni Reining, PA-C  traMADol (ULTRAM) 50 MG tablet Take 1 tablet (50 mg total) every 6 (six) hours as needed by mouth for moderate pain. Patient not taking: Reported on 07/30/2019 07/03/17   Joni Reining, PA-C    Allergies Vicodin [hydrocodone-acetaminophen]  History reviewed. No pertinent family history.  Social History Social History   Tobacco Use  . Smoking status: Current Every Day Smoker    Packs/day: 2.00    Types: Cigarettes  . Smokeless tobacco: Never Used  Substance Use Topics  . Alcohol use: No  . Drug use: Yes    Types: Cocaine     Review of Systems  Constitutional: No fever/chills Eyes:  No discharge ENT: No upper respiratory complaints. Respiratory: no cough. No SOB/ use of accessory muscles to breath Gastrointestinal:   No nausea, no vomiting.  No diarrhea.  No constipation. Musculoskeletal: Patient has left shoulder pain.  Skin: Negative for rash, abrasions, lacerations, ecchymosis.   ____________________________________________   PHYSICAL EXAM:  VITAL  SIGNS: ED Triage Vitals  Enc Vitals Group     BP 12/05/19 1753 137/81     Pulse Rate 12/05/19 1753 (!) 101     Resp 12/05/19 1753 18     Temp 12/05/19 1753 97.9 F (36.6 C)     Temp Source 12/05/19 1753 Oral     SpO2 12/05/19 1753 98 %     Weight 12/05/19 1753 210 lb (95.3 kg)     Height 12/05/19 1753 5\' 6"  (1.676 m)     Head Circumference --      Peak Flow --      Pain Score 12/05/19 1752 10     Pain Loc --      Pain Edu? --      Excl. in [place]? --      Constitutional: Alert and  oriented. Well appearing and in no acute distress. Eyes: Conjunctivae are normal. PERRL. EOMI. Head: Atraumatic. Cardiovascular: Normal rate, regular rhythm. Normal S1 and S2.  Good peripheral circulation. Respiratory: Normal respiratory effort without tachypnea or retractions. Lungs CTAB. Good air entry to the bases with no decreased or absent breath sounds Gastrointestinal: Bowel sounds x 4 quadrants. Soft and nontender to palpation. No guarding or rigidity. No distention. Musculoskeletal: Patient has difficulty performing abduction of the left shoulder.  He can perform cross body abduction.  He has pain with rotator cuff testing.  Palpable radial pulse, bilaterally and symmetrically. Neurologic:  Normal for age. No gross focal neurologic deficits are appreciated.  Skin:  Skin is warm, dry and intact. No rash noted. Psychiatric: Mood and affect are normal for age. Speech and behavior are normal.   ____________________________________________   LABS (all labs ordered are listed, but only abnormal results are displayed)  Labs Reviewed - No data to display ____________________________________________  EKG   ____________________________________________  RADIOLOGY 12/07/19, personally viewed and evaluated these images (plain radiographs) as part of my medical decision making, as well as reviewing the written report by the radiologist.  DG Shoulder Left  Result Date: 12/05/2019 CLINICAL DATA:  c/o L shoulder and arm pain x2 months. States he is unable to fully extend arm d/t pain described as "pulling" in bicep area. Pt reports he was doing some heavy lifting today at work and felt a pop in shoulder and pain worsened since then. EXAM: LEFT SHOULDER - 2+ VIEW COMPARISON:  None. FINDINGS: No fracture or bone lesion. Glenohumeral and AC joints are normally spaced and aligned. No degenerative/arthropathic change. Soft tissues are unremarkable. IMPRESSION: Negative. Electronically Signed    By: 12/07/2019 M.D.   On: 12/05/2019 19:09    ____________________________________________    PROCEDURES  Procedure(s) performed:     Procedures     Medications - No data to display   ____________________________________________   INITIAL IMPRESSION / ASSESSMENT AND PLAN / ED COURSE  Pertinent labs & imaging results that were available during my care of the patient were reviewed by me and considered in my medical decision making (see chart for details).    Assessment and plan Left shoulder pain 54 year old male presents to the emergency department with acute left shoulder pain for the past 3 months.  Patient was mildly tachycardic at triage but vital signs were otherwise reassuring.  On physical, patient had left rotator cuff weakness and pain with testing.  Toradol was given in the emergency department for pain and a sling was given for comfort.  I relayed to the patient that I have high suspicion for a  partial rotator cuff tear and felt like a nonemergent MRI was likely warranted.  Patient was given outpatient follow-up with orthopedics.  Toradol was prescribed for pain.  All patient questions were answered.       ____________________________________________  FINAL CLINICAL IMPRESSION(S) / ED DIAGNOSES  Final diagnoses:  Acute pain of left shoulder      NEW MEDICATIONS STARTED DURING THIS VISIT:  ED Discharge Orders         Ordered    ketorolac (TORADOL) 10 MG tablet  Every 6 hours PRN     12/05/19 1915              This chart was dictated using voice recognition software/Dragon. Despite best efforts to proofread, errors can occur which can change the meaning. Any change was purely unintentional.     Karren Cobble 12/05/19 Peterson Ao, MD 12/05/19 2236

## 2019-12-05 NOTE — Discharge Instructions (Signed)
Toradol can be taken at home for pain. Please follow up with ortho, Dr. Martha Clan. Sling can be used for comfort.

## 2019-12-05 NOTE — ED Triage Notes (Signed)
Pt presents to ED c/o L shoulder and arm pain x2 months. States he is unable to fully extend arm d/t pain described as "pulling" in bicep area. Pt reports he was doing some heavy lifting today at work and felt a pop in shoulder and pain worsened since then. Pt states visit is NOT worker's comp.

## 2020-05-05 IMAGING — CR DG SHOULDER 2+V*L*
3 series · 3 of 3 positions shown · non-contrast
Comparison: None.

CLINICAL DATA: c/o L shoulder and arm pain x2 months. States he is
unable to fully extend arm d/t pain described as "pulling" in bicep
area. Pt reports he was doing some heavy lifting today at work and
felt a pop in shoulder and pain worsened since then.

EXAM:
LEFT SHOULDER - 2+ VIEW

[shoulder grashey]
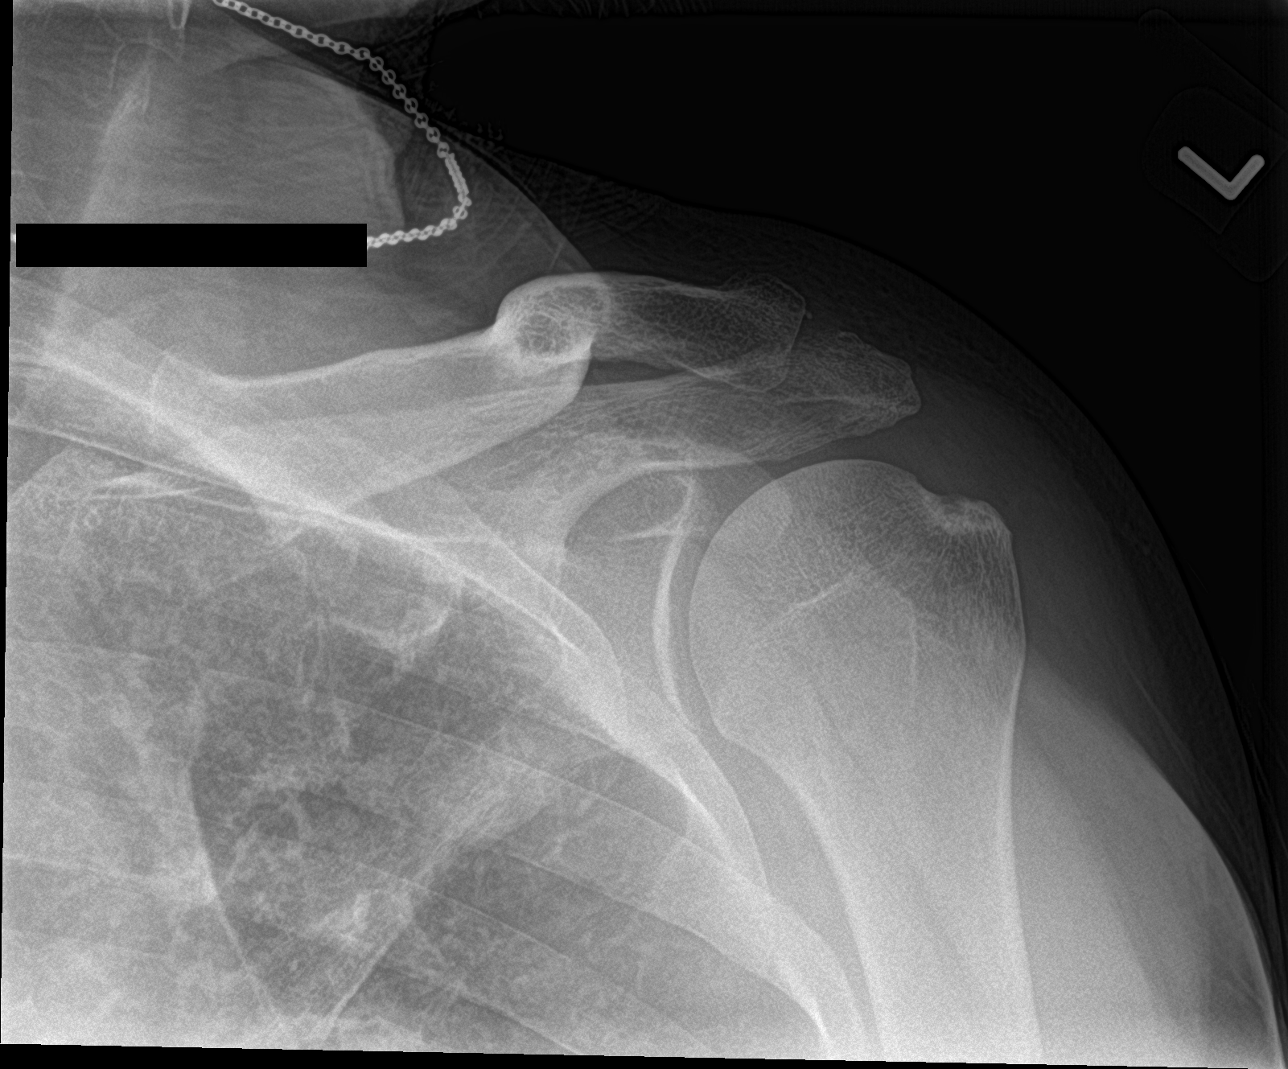

[shoulder y view]
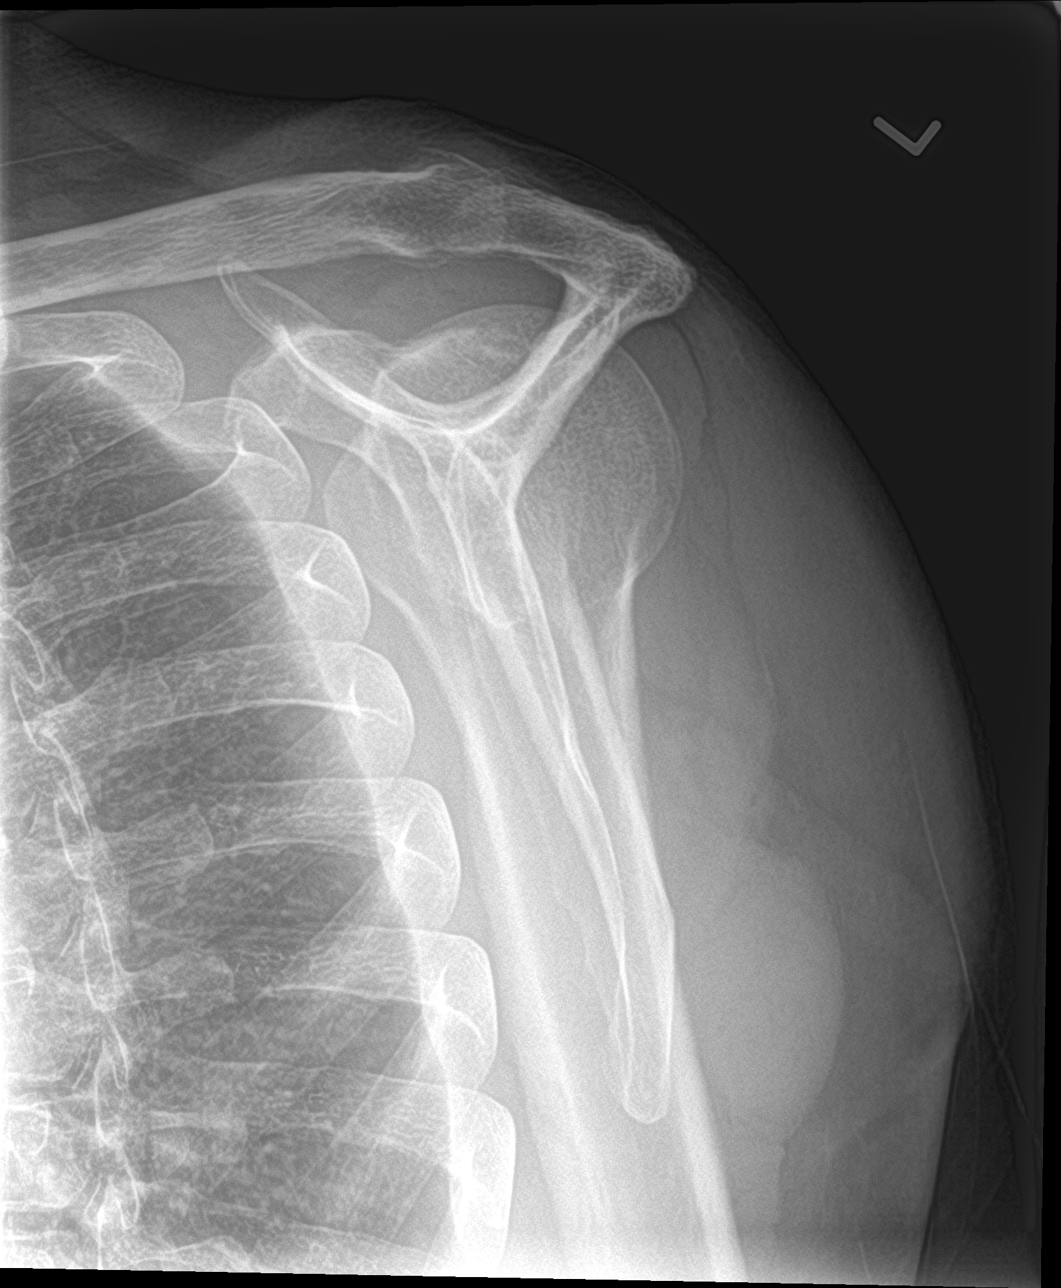

[shoulder axillary]
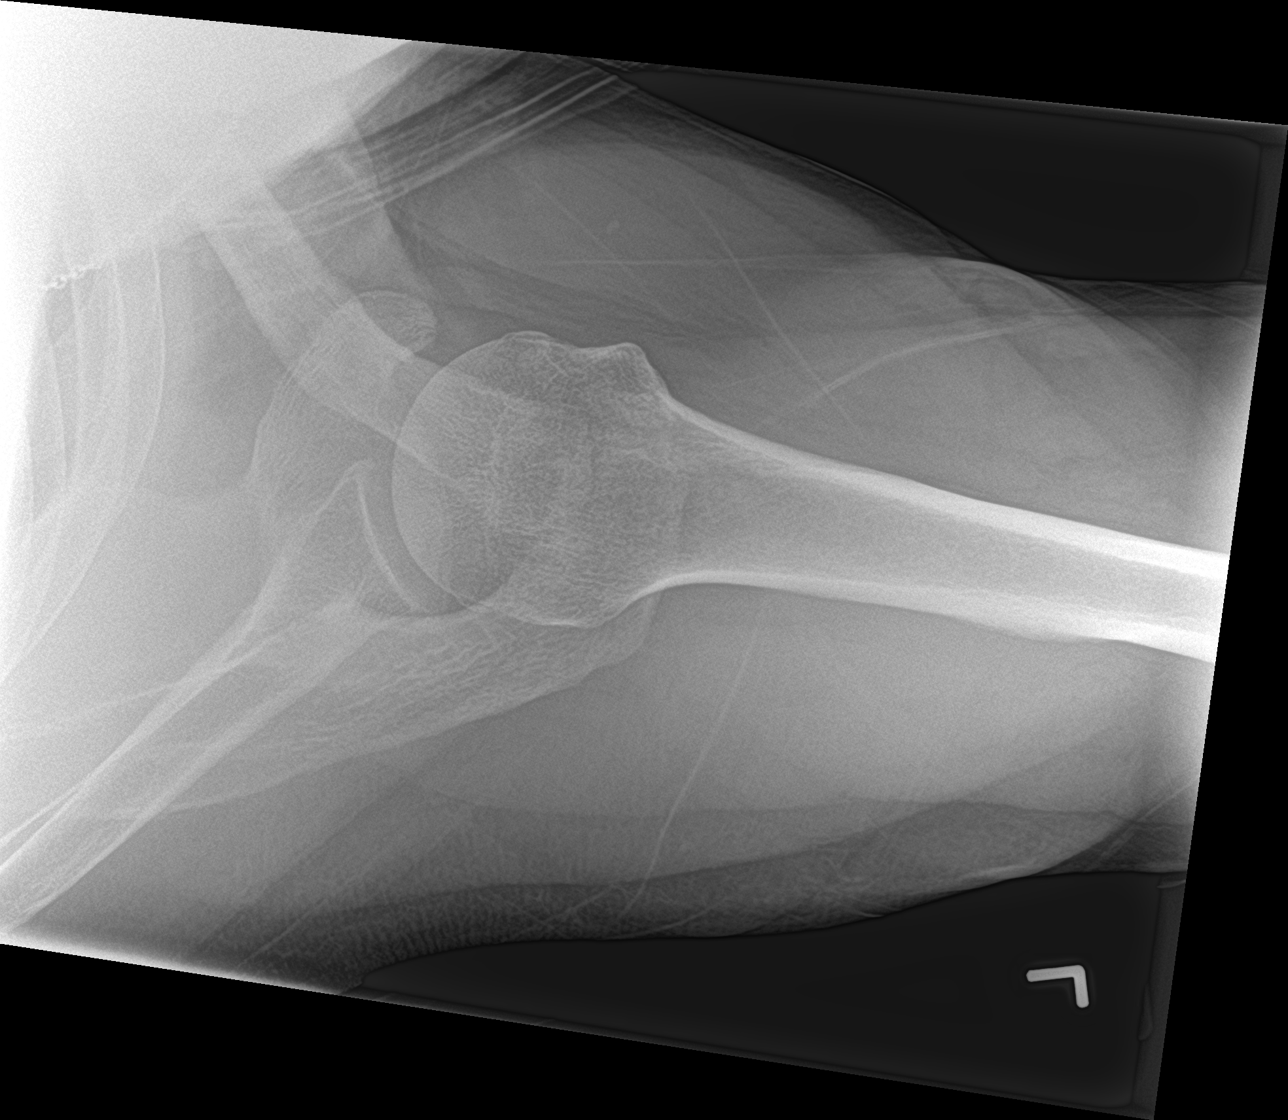

[3 of 3 positions shown; findings below may reference images not displayed]

FINDINGS: No fracture or bone lesion.

Glenohumeral and AC joints are normally spaced and aligned. No
degenerative/arthropathic change.

Soft tissues are unremarkable.
IMPRESSION: Negative.

## 2024-07-02 ENCOUNTER — Encounter: Payer: Self-pay | Admitting: Dermatology

## 2024-07-02 ENCOUNTER — Ambulatory Visit: Admitting: Dermatology

## 2024-07-02 DIAGNOSIS — L578 Other skin changes due to chronic exposure to nonionizing radiation: Secondary | ICD-10-CM

## 2024-07-02 DIAGNOSIS — C44612 Basal cell carcinoma of skin of right upper limb, including shoulder: Secondary | ICD-10-CM

## 2024-07-02 DIAGNOSIS — D492 Neoplasm of unspecified behavior of bone, soft tissue, and skin: Secondary | ICD-10-CM

## 2024-07-02 DIAGNOSIS — C4491 Basal cell carcinoma of skin, unspecified: Secondary | ICD-10-CM

## 2024-07-02 DIAGNOSIS — W908XXA Exposure to other nonionizing radiation, initial encounter: Secondary | ICD-10-CM | POA: Diagnosis not present

## 2024-07-02 HISTORY — DX: Basal cell carcinoma of skin, unspecified: C44.91

## 2024-07-02 NOTE — Patient Instructions (Addendum)
 Some Recommended Sunscreens for Sensitive Skin Include:  Body or All Over Sunscreen (water resistant) EltaMD UV Pure Blue lizard sensitive Sun bum mineral (avoid if sensitive to scent) Aveeno Positively Mineral Neutrogena sheer zinc (Slightly harder to rub in) [facility]S clear zinc (Slightly harder to rub in) Vanicream mineral sunscreen spf 50+  Face Sunscreen EltaMD UV Elements (tinted) EltaMD UV Restore (tinted or nontinted) EltaMD UV Physical (tinted) CeraVe hydrating sunscreen 50 face (tinted or nontinted) Colorescience Sunforgettable Total Protection Face Shield (good for most skin tones) La Roche Posay Mineral Tinted Cotz Flawless Complexion   Powder Sunscreen (Nice for reapplying or applying on the go) Colorescience Sunforgettable Total Protection Brush on Shield (available in different tints)  Wound Care Instructions  Cleanse wound gently with soap and water once a day then pat dry with clean gauze. Apply a thin coat of Petrolatum (petroleum jelly, Vaseline) over the wound (unless you have an allergy to this). We recommend that you use a new, sterile tube of Vaseline. Do not pick or remove scabs. Do not remove the yellow or Mcclimans healing tissue from the base of the wound.  Cover the wound with fresh, clean, nonstick gauze and secure with paper tape. You may use Band-Aids in place of gauze and tape if the wound is small enough, but would recommend trimming much of the tape off as there is often too much. Sometimes Band-Aids can irritate the skin.  You should call the office for your biopsy report after 1 week if you have not already been contacted.  If you experience any problems, such as abnormal amounts of bleeding, swelling, significant bruising, significant pain, or evidence of infection, please call the office immediately.  FOR ADULT SURGERY PATIENTS: If you need something for pain relief you may take 1 extra strength Tylenol (acetaminophen) AND 2 Ibuprofen  (200mg  each)  together every 4 hours as needed for pain. (do not take these if you are allergic to them or if you have a reason you should not take them.) Typically, you may only need pain medication for 1 to 3 days.     Due to recent changes in healthcare laws, you may see results of your pathology and/or laboratory studies on MyChart before the doctors have had a chance to review them. We understand that in some cases there may be results that are confusing or concerning to you. Please understand that not all results are received at the same time and often the doctors may need to interpret multiple results in order to provide you with the best plan of care or course of treatment. Therefore, we ask that you please give us  2 business days to thoroughly review all your results before contacting the office for clarification. Should we see a critical lab result, you will be contacted sooner.   If You Need Anything After Your Visit  If you have any questions or concerns for your doctor, please call our main line at [phone] and press option 4 to reach your doctor's medical assistant. If no one answers, please leave a voicemail as directed and we will return your call as soon as possible. Messages left after 4 pm will be answered the following business day.   You may also send us  a message via MyChart. We typically respond to MyChart messages within 1-2 business days.  For prescription refills, please ask your pharmacy to contact our office. Our fax number is [phone].  If you have an urgent issue when the clinic is closed that cannot wait  until the next business day, you can page your doctor at the number below.    Please note that while we do our best to be available for urgent issues outside of office hours, we are not available 24/7.   If you have an urgent issue and are unable to reach us , you may choose to seek medical care at your doctor's office, retail clinic, urgent care center, or emergency  room.  If you have a medical emergency, please immediately call 911 or go to the emergency department.  Pager Numbers  - Dr. Hester: [phone]  - Dr. Jackquline: [phone]  - Dr. Claudene: [phone]   In the event of inclement weather, please call our main line at [phone] for an update on the status of any delays or closures.  Dermatology Medication Tips: Please keep the boxes that topical medications come in in order to help keep track of the instructions about where and how to use these. Pharmacies typically print the medication instructions only on the boxes and not directly on the medication tubes.   If your medication is too expensive, please contact our office at [phone] option 4 or send us  a message through MyChart.   We are unable to tell what your co-pay for medications will be in advance as this is different depending on your insurance coverage. However, we may be able to find a substitute medication at lower cost or fill out paperwork to get insurance to cover a needed medication.   If a prior authorization is required to get your medication covered by your insurance company, please allow us  1-2 business days to complete this process.  Drug prices often vary depending on where the prescription is filled and some pharmacies may offer cheaper prices.  The website www.goodrx.com contains coupons for medications through different pharmacies. The prices here do not account for what the cost may be with help from insurance (it may be cheaper with your insurance), but the website can give you the price if you did not use any insurance.  - You can print the associated coupon and take it with your prescription to the pharmacy.  - You may also stop by our office during regular business hours and pick up a GoodRx coupon card.  - If you need your prescription sent electronically to a different pharmacy, notify our office through [facility] or by phone at  [phone] option 4.     Si Usted Necesita Algo Despus de Su Visita  Tambin puede enviarnos un mensaje a travs de Clinical Cytogeneticist. Por lo general respondemos a los mensajes de MyChart en el transcurso de 1 a 2 das hbiles.  Para renovar recetas, por favor pida a su farmacia que se ponga en contacto con nuestra oficina. Randi lakes de fax es [place] [phone].  Si tiene un asunto urgente cuando la clnica est cerrada y que no puede esperar hasta el siguiente da hbil, puede llamar/localizar a su doctor(a) al nmero que aparece a continuacin.   Por favor, tenga en cuenta que aunque hacemos todo lo posible para estar disponibles para asuntos urgentes fuera del horario de [place], no estamos disponibles las 24 horas del da, los 7 [address]  [address] de la [place].   Si tiene un problema urgente y no puede comunicarse con nosotros, puede optar por buscar atencin mdica  en el consultorio de su doctor(a), en una clnica privada, en un centro de atencin urgente o en una sala de emergencias.  Si tiene kelly services, por favor llame  inmediatamente al 911 o vaya a la sala de sports administrator.  Nmeros de bper  - Dr. Hester: [phone]  - Dra. Jackquline: [phone]  - Dr. Claudene: [phone]   En caso de inclemencias del tiempo, por favor llame a landry capes principal al [phone] para una actualizacin sobre el [place] de cualquier retraso o cierre.  Consejos para la medicacin en dermatologa: Por favor, guarde las cajas en las que vienen los medicamentos de uso tpico para ayudarle a seguir las instrucciones sobre dnde y cmo usarlos. Las farmacias generalmente imprimen las instrucciones del medicamento slo en las cajas y no directamente en los tubos del [place].   Si su medicamento es muy caro, por favor, pngase en contacto con landry rieger llamando al [phone] y presione la opcin 4 o envenos un mensaje a travs de Clinical Cytogeneticist.   No podemos decirle cul ser su copago por los  medicamentos por adelantado ya que esto es diferente dependiendo de la cobertura de su seguro. Sin embargo, es posible que podamos encontrar un medicamento sustituto a audiological scientist un formulario para que el seguro cubra el medicamento que se considera necesario.   Si se requiere una autorizacin previa para que su compaa de seguros cubra su medicamento, por favor permtanos de 1 a 2 das hbiles para completar este proceso.  Los precios de los medicamentos varan con frecuencia dependiendo del environmental consultant de dnde se surte la receta y alguna farmacias pueden ofrecer precios ms baratos.  El sitio web www.goodrx.com tiene cupones para medicamentos de health and safety inspector. Los precios aqu no tienen en cuenta lo que podra costar con la ayuda del seguro (puede ser ms barato con su seguro), pero el sitio web puede darle el precio si no utiliz tourist information centre manager.  - Puede imprimir el cupn correspondiente y llevarlo con su receta a la farmacia.  - Tambin puede pasar por nuestra oficina durante el horario de atencin regular y education officer, museum una tarjeta de cupones de GoodRx.  - Si necesita que su receta se enve electrnicamente a una farmacia diferente, informe a nuestra oficina a travs de MyChart de [place] o por telfono llamando al [phone] y presione la opcin 4.

## 2024-07-02 NOTE — Progress Notes (Signed)
   New Patient Visit   Subjective  Brett Oneill is a 58 y.o. male who presents for the following: Lesion of concern at right upper arm x3 years, growing, painful especially if he bumps into something. No personal or family hx of skin cancer. 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   The following portions of the chart were reviewed this encounter and updated as appropriate: medications, allergies, medical history  Review of Systems:  No other skin or systemic complaints except as noted in HPI or Assessment and Plan.  Objective  Well appearing patient in no apparent distress; mood and affect are within normal limits.  A focused examination was performed of the following areas: Right upper arm  Relevant exam findings are noted in the Assessment and Plan.    Right upper arm 2.4 x 1.9 cm pink keratotic plaque    Assessment & Plan   ACTINIC DAMAGE - chronic, secondary to cumulative UV radiation exposure/sun exposure over time - diffuse scaly erythematous macules with underlying dyspigmentation - Recommend daily broad spectrum sunscreen SPF 30+ to sun-exposed areas, reapply every 2 hours as needed.  - Recommend staying in the shade or wearing long sleeves, sun glasses (UVA+UVB protection) and wide brim hats (4-inch brim around the entire circumference of the hat). - Call for new or changing lesions.  NEOPLASM OF SKIN Right upper arm Epidermal / dermal shaving  Lesion diameter (cm):  2.4 Informed consent: discussed and consent obtained   Timeout: patient name, date of birth, surgical site, and procedure verified   Procedure prep:  Patient was prepped and draped in usual sterile fashion Prep type:  Isopropyl alcohol Anesthesia: the lesion was anesthetized in a standard fashion   Anesthetic:  1% lidocaine w/ epinephrine 1-100,000 buffered w/ 8.4% NaHCO3 Instrument used: DermaBlade   Hemostasis achieved with: pressure, aluminum chloride and  electrodesiccation   Outcome: patient tolerated procedure well   Post-procedure details: wound care instructions given    Specimen 1 - Surgical pathology Differential Diagnosis: SCC  Check Margins: No 2.4 x 1.9 cm pink keratotic plaque 2 pieces, deep margin is thinner piece Patient advised if biopsy proves skin cancer would require further treatment.  ACTINIC ELASTOSIS    Return in 6 months (on 12/30/2024) for TBSE, w/ Dr. Claudene.  I, Jacquelynn V. Wilfred, CMA, am acting as scribe for Boneta Claudene, MD.  Documentation: I have reviewed the above documentation for accuracy and completeness, and I agree with the above.  Boneta Claudene, MD

## 2024-07-03 ENCOUNTER — Ambulatory Visit: Payer: Self-pay | Admitting: Dermatology

## 2024-07-03 DIAGNOSIS — C44612 Basal cell carcinoma of skin of right upper limb, including shoulder: Secondary | ICD-10-CM

## 2024-07-03 LAB — SURGICAL PATHOLOGY

## 2024-07-04 ENCOUNTER — Encounter: Payer: Self-pay | Admitting: Dermatology

## 2024-07-04 NOTE — Telephone Encounter (Signed)
Left message for patient to return call regarding biopsy results.

## 2024-07-04 NOTE — Telephone Encounter (Signed)
-----   Message from [place] sent at 07/03/2024  8:05 PM EST ----- Diagnosis: right upper arm :       BASAL CELL CARCINOMA, NODULAR PATTERN, PERIPHERAL AND DEEP MARGINS INVOLVED   Please call with diagnosis and determine where the patient would like to have Mohs surgery.  Explanation: your biopsy shows a basal cell skin cancer in the second layer of the skin. This is the most common kind of skin cancer and is caused by damage from sun exposure. Basal cell skin cancers  almost never spread beyond the skin, so they are not dangerous to your overall health. However, they will continue to grow, can bleed, cause nonhealing wounds, and disrupt nearby structures unless  fully treated.  Treatment: Given the location and type of skin cancer, I recommend Mohs surgery. Mohs surgery involves cutting out the skin cancer and then checking under the microscope to ensure the whole skin  cancer was removed. If any skin cancer remains, the surgeon will cut out more until it is fully removed. The cure rate is about 98-99%. Once the Mohs surgeon confirms the skin cancer is out, they  will discuss the options to repair or heal the area. You must take it easy for about two weeks after surgery (no lifting over 10-15 lbs, avoid activity to get your heart rate and blood pressure up).  It is done at another office outside of [place] ([place], [place], or [place]). ----- Message ----- From: Interface, Lab In Three Zero One Sent: 07/03/2024   5:58 PM EST To: Boneta Sharps, MD

## 2024-07-08 NOTE — Addendum Note (Signed)
 Addended by: TERESA PALMA R on: 07/08/2024 09:02 AM   Modules accepted: Orders

## 2024-07-08 NOTE — Telephone Encounter (Signed)
 Patient advised of BX results and referred to Dr. Paci for [facility]. All questions answered. aw

## 2024-09-05 ENCOUNTER — Encounter: Payer: Self-pay | Admitting: Dermatology

## 2024-09-12 ENCOUNTER — Encounter: Admitting: Dermatology

## 2024-12-30 ENCOUNTER — Ambulatory Visit: Admitting: Dermatology
